# Patient Record
Sex: Male | Born: 1985 | Race: White | Hispanic: No | Marital: Married | Smoking: Current every day smoker
Health system: Southern US, Community
[De-identification: ages and names within clinical notes are randomized; demographics above are authoritative.]

## PROBLEM LIST (undated history)

## (undated) DIAGNOSIS — S0219XA Other fracture of base of skull, initial encounter for closed fracture: Secondary | ICD-10-CM

## (undated) HISTORY — DX: Other fracture of base of skull, initial encounter for closed fracture: S02.19XA

## (undated) HISTORY — PX: WISDOM TOOTH EXTRACTION: SHX21

---
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
## 2008-12-12 ENCOUNTER — Inpatient Hospital Stay (HOSPITAL_COMMUNITY): Admission: EM | Admit: 2008-12-12 | Discharge: 2008-12-13 | Payer: Self-pay | Admitting: Emergency Medicine

## 2011-02-01 LAB — URINALYSIS, ROUTINE W REFLEX MICROSCOPIC
Bilirubin Urine: NEGATIVE
Glucose, UA: NEGATIVE mg/dL
Hgb urine dipstick: NEGATIVE
Ketones, ur: 15 mg/dL — AB
Nitrite: NEGATIVE
Protein, ur: NEGATIVE mg/dL
Specific Gravity, Urine: 1.023 (ref 1.005–1.030)
Urobilinogen, UA: 1 mg/dL (ref 0.0–1.0)
pH: 6 (ref 5.0–8.0)

## 2011-02-01 LAB — CBC
HCT: 38.2 % — ABNORMAL LOW (ref 39.0–52.0)
Hemoglobin: 13.7 g/dL (ref 13.0–17.0)
MCHC: 35.8 g/dL (ref 30.0–36.0)
MCV: 86.9 fL (ref 78.0–100.0)
Platelets: 227 10*3/uL (ref 150–400)
RBC: 4.4 MIL/uL (ref 4.22–5.81)
RDW: 12.7 % (ref 11.5–15.5)
WBC: 11.1 10*3/uL — ABNORMAL HIGH (ref 4.0–10.5)

## 2011-02-01 LAB — CULTURE, ROUTINE-ABSCESS

## 2011-02-01 LAB — ANAEROBIC CULTURE

## 2011-03-01 NOTE — Op Note (Signed)
NAME:  MALAKYE, NOLDEN           ACCOUNT NO.:  [id number]   MEDICAL RECORD NO.:  [id number]          PATIENT TYPE:  INP   LOCATION:  5114                         FACILITY:  [facility]   PHYSICIAN:  Angelia Mould. Derrell Lolling, M.D.DATE OF BIRTH:  [birth year]   DATE OF PROCEDURE:  12/12/2008  DATE OF DISCHARGE:                               OPERATIVE REPORT   PREOPERATIVE DIAGNOSIS:  Perirectal abscess.   POSTOPERATIVE DIAGNOSIS:  Perirectal abscess.   OPERATION PERFORMED:  Anoscopy and incision and drainage of deep  ischiorectal abscess (left posterior position).   SURGEON:  Angelia Mould. Derrell Lolling, MD   OPERATIVE INDICATIONS:  This is a healthy 25 year old Caucasian male who  presents with a 4-day history of progressive pain and swelling in his  left perianal area.  He came to the emergency room.  We were asked to  see him.  Examination reveals a 5-cm fluctuant perirectal abscess in the  left posterior position with some overlying skin blistering.  He is  brought to the operating room urgently.   OPERATIVE TECHNIQUE:  Following the induction of general endotracheal  anesthesia, the patient was identified as correct patient and correct  procedure and correct site.  He was placed in a dorsal lithotomy  position.  Perianal area was prepped and draped in sterile fashion.  He  had received intravenous antibiotics prior to the procedure.  I  performed anoscopy with a large anoscope and looked circumferentially  and the anal canal and distal rectal mucosa all looked normal.  I did  not see any draining sinuses or any evidence of inflammation.  Digital  rectal exam revealed really no bulging into the rectal lumen.   I then performed an incision and drainage of the perirectal abscess by  making a radially oriented elliptical incision taking about a 1-cm  ellipse of skin.  The incision itself was probably 3-4 cm in length.  I  cultured the purulent material which was foul smelling.  I evacuated all  of  this, digitally explored the abscess cavity to make sure that all  loculations were broken up.  This was then irrigated with saline.  The  tissues were inflamed and bled freely but after holding pressure for  about 3 minutes, there was almost no bleeding.  I irrigated out one more  time.  I then packed the wound loosely with 1-inch Iodoform gauze.  Bulky dry absorbent dressing was placed, and the patient was taken to  recovery room in stable condition.  Estimated blood loss was about 25  mL.  Complications none.  Sponge, needle, and instrument counts were  correct.      Angelia Mould. Derrell Lolling, M.D.  Electronically Signed     HMI/MEDQ  D:  12/12/2008  T:  12/12/2008  Job:  [id number]

## 2011-03-01 NOTE — H&P (Signed)
NAME:  William Johnson, William Johnson           ACCOUNT NO.:  [id number]   MEDICAL RECORD NO.:  [id number]          PATIENT TYPE:  INP   LOCATION:  2550                         FACILITY:  [facility]   PHYSICIAN:  Angelia Mould. Derrell Lolling, M.D.DATE OF BIRTH:  11-03-1985   DATE OF ADMISSION:  12/12/2008  DATE OF DISCHARGE:                              HISTORY & PHYSICAL   CHIEF COMPLAINT:  Left perirectal abscess.   HISTORY OF PRESENT ILLNESS:  William Johnson is an otherwise healthy 25-  year-old male patient presented to the ED during the night because of  complaints of dysuria and difficulty voiding as well as severe buttock  pain.  On exam, he was found to have a very large left perirectal  abscess.  CBC is pending at the time of dictation.  Urinalysis is  negative.  The patient was aware he had some sort of small, tender, not  like the area there, but has never had anything like this before.   REVIEW OF SYSTEMS:  As per history of present illness.  No fevers, no  chills, no diarrhea.  He has had increased pain over several days.  He  denies any injury or any prior issues of perirectal abscess.   SOCIAL HISTORY:  Positive for tobacco half a pack per day, socially  alcohol.  Lives with his parents.   FAMILY HISTORY:  Noncontributory.   PAST MEDICAL HISTORY:  The patient denies.   PAST SURGICAL HISTORY:  Wisdom teeth.   DRUG ALLERGIES:  NKDA.   MEDICATIONS:  None.   PHYSICAL EXAMINATION:  GENERAL:  Pleasant male patient complaining of  left perirectal pain.  States he has voided since arrival to the ER.  VITAL SIGNS:  Initial temperature was 100.8, now it is 97.8; BP is  91/44; presenting heart rate was 127, now it is 106; respirations 13,  sat is 95-96% on room air.  PSYCHIATRIC:  The patient is alert and oriented x3.  Affect is  appropriate for the current situation.  NEUROLOGIC:  Cranial nerves II through XII are grossly intact.  He is  moving all extremities x4 without focal deficits.  HEENT:   Eyes, sclerae are slightly injected.  Ear, nose, and throat;  ears are symmetrical.  No otorrhea.  Nose is midline.  No rhinorrhea.  Oral mucous membranes pink and moist.  CHEST:  Bilateral lung sounds are clear to auscultation.  Respiratory  effort is nonlabored.  He is on room air.  No tachypnea.  CARDIOVASCULAR:  Heart sounds S1 and S2.  No rubs, murmurs, or gallops.  No JVD.  Pulse is regular and tachycardic, but heart rate is less than  125.  No peripheral edema.  ABDOMEN:  Soft, nontender, and nondistended without hepatosplenomegaly,  masses, scars, or bruits.  EXTREMITIES:  Symmetrical in appearance without cyanosis or clubbing.  GENITOURINARY:  External genitalia are normal in appearance.  Both  testes are descended.  There is no tracking of the abscess into the  scrotum or penis.  RECTAL:  There is a 5-6 cm. abscess in the left posterior position with  overlying skin bullae. There is no skin necrosis. External  rectal exam  was performed on DRE.  No drainage.  Small external hemorrhoids.  Tone  appears to be normal.   LABORATORY STUDIES:  CBC pending.  Urinalysis is normal.   IMPRESSION:  Left perirectal abscess, 5-6 cm.   PLAN:  Dr. Derrell Lolling has interviewed and examined the patient and discussed  operative intervention need with the patient and his father.  The  patient does have a very large perirectal abscess that would be best  drained in the OR setting.  Both agree that this is appropriate and the  patient is amenable to surgery.   Empiric Zosyn for enteric pathogen coverage.   Treat symptoms, IV fluids for tachycardia and volume depletion.  Morphine for pain and Zofran for nausea.      Allison L. Rennis Harding, N.P.      Angelia Mould. Derrell Lolling, M.D.  Electronically Signed    ALE/MEDQ  D:  12/12/2008  T:  12/12/2008  Job:  [id number]

## 2011-03-04 NOTE — Op Note (Signed)
NAME:  BRAVE, DACK NO.:  [id number]   MEDICAL RECORD NO.:  [id number]                   PATIENT TYPE:  AMB   LOCATION:  DSC                                  FACILITY:  [facility]   PHYSICIAN:  Leonides Grills, M.D.                  DATE OF BIRTH:  30-May-1986   DATE OF PROCEDURE:  DATE OF DISCHARGE:                                 OPERATIVE REPORT   PREOPERATIVE DIAGNOSIS:  Right second metatarsal head Freiberg's infraction.   POSTOPERATIVE DIAGNOSIS:  Right second metatarsal head Freiberg's  infraction.   OPERATION PERFORMED:  1. Right metatarsophalangeal joint local synovectomy.  2. Right partial excision, second metatarsal head.   SURGEON:  Leonides Grills, M.D.   ASSISTANT:  Lianne Cure, P.A.   ANESTHESIA:  General endotracheal.   ESTIMATED BLOOD LOSS:  Minimal.   TOURNIQUET TIME:  Approximately 20 minutes.   COMPLICATIONS:  None.   DISPOSITION:  Stable to the PR.   INDICATIONS FOR PROCEDURE:  The patient is a 25 year old gentleman who has  had pain involving the right second MTP joint to the point where it is  interfering with his life.  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  He was consented for the above  procedure.  All risks which include infection, neurovascular injury,  scarring dorsally with a dorsal contracture of the toe and possible future  dorsiflexion osteotomy for further advancement of the disease were all  explained.  Questions were encouraged and answered.   DESCRIPTION OF PROCEDURE:  The patient was brought to the operating room and  placed in supine position.  After adequate general endotracheal tube  anesthesia was administered as well as Ancef 1 gm IV piggyback.  The right  lower extremity was then prepped and draped in sterile manner over a  proximally placed thigh tourniquet.  The right  lower extremity was then  gravity exsanguinated.  The tourniquet was elevated to 290 mmHg.  Longitudinal incision over the midline aspect of the second MTP joint was  then made.  The extensor tendons were then identified and a capsulotomy was  then made medial to the extensor tendon.  The joint was then entered.  There  was a large amount of synovectomy dorsally and this was debrided with a  rongeur.  There was also a large spur on dorsal aspect of the second  metatarsal head which was removed with a rongeur.  This extended both  medially and laterally as well.  The joint surface was then inspected and  surprisingly it was in pristine condition.  There was no delamination of the  cartilage.  It was well maintained to the head throughout.  There was no  loose bodies as well.  The wound and  joint were copiously irrigated with normal saline.  The  skin was closed with  4-0 nylon.  The toe was ranged and tracked very well and it stayed in  excellent position as well.                                                Leonides Grills, M.D.    PB/MEDQ  D:  10/08/2002  T:  10/08/2002  Job:  [id number]

## 2017-05-15 ENCOUNTER — Emergency Department: Payer: Self-pay

## 2017-05-15 ENCOUNTER — Emergency Department
Admission: EM | Admit: 2017-05-15 | Discharge: 2017-05-15 | Disposition: A | Discharge status: Home / Self Care | Payer: Self-pay | Attending: Emergency Medicine | Admitting: Emergency Medicine

## 2017-05-15 DIAGNOSIS — Y9389 Activity, other specified: Secondary | ICD-10-CM | POA: Insufficient documentation

## 2017-05-15 DIAGNOSIS — W01198A Fall on same level from slipping, tripping and stumbling with subsequent striking against other object, initial encounter: Secondary | ICD-10-CM | POA: Insufficient documentation

## 2017-05-15 DIAGNOSIS — S0990XA Unspecified injury of head, initial encounter: Secondary | ICD-10-CM

## 2017-05-15 DIAGNOSIS — F10920 Alcohol use, unspecified with intoxication, uncomplicated: Secondary | ICD-10-CM

## 2017-05-15 DIAGNOSIS — Y998 Other external cause status: Secondary | ICD-10-CM | POA: Insufficient documentation

## 2017-05-15 DIAGNOSIS — Y9259 Other trade areas as the place of occurrence of the external cause: Secondary | ICD-10-CM | POA: Insufficient documentation

## 2017-05-15 DIAGNOSIS — F10129 Alcohol abuse with intoxication, unspecified: Secondary | ICD-10-CM | POA: Insufficient documentation

## 2017-05-15 DIAGNOSIS — S0101XA Laceration without foreign body of scalp, initial encounter: Secondary | ICD-10-CM | POA: Insufficient documentation

## 2017-05-15 DIAGNOSIS — F17200 Nicotine dependence, unspecified, uncomplicated: Secondary | ICD-10-CM | POA: Insufficient documentation

## 2017-05-15 LAB — CBC
HEMATOCRIT: 42.4 % (ref 40.0–52.0)
Hemoglobin: 14.7 g/dL (ref 13.0–18.0)
MCH: 30 pg (ref 26.0–34.0)
MCHC: 34.5 g/dL (ref 32.0–36.0)
MCV: 87 fL (ref 80.0–100.0)
Platelets: 360 10*3/uL (ref 150–440)
RBC: 4.88 MIL/uL (ref 4.40–5.90)
RDW: 12.8 % (ref 11.5–14.5)
WBC: 10.7 10*3/uL — AB (ref 3.8–10.6)

## 2017-05-15 LAB — BASIC METABOLIC PANEL
ANION GAP: 12 (ref 5–15)
BUN: 9 mg/dL (ref 6–20)
CALCIUM: 9 mg/dL (ref 8.9–10.3)
CO2: 21 mmol/L — AB (ref 22–32)
Chloride: 106 mmol/L (ref 101–111)
Creatinine, Ser: 0.73 mg/dL (ref 0.61–1.24)
Glucose, Bld: 103 mg/dL — ABNORMAL HIGH (ref 65–99)
Potassium: 3.6 mmol/L (ref 3.5–5.1)
Sodium: 139 mmol/L (ref 135–145)

## 2017-05-15 LAB — ETHANOL: Alcohol, Ethyl (B): 382 mg/dL (ref <5)

## 2017-05-15 MED ORDER — LORAZEPAM 2 MG/ML IJ SOLN
2.0000 mg | Freq: Once | INTRAMUSCULAR | Status: AC
  Administered 2017-05-15: 2 mg via INTRAVENOUS

## 2017-05-15 MED ORDER — LIDOCAINE-EPINEPHRINE-TETRACAINE (LET) SOLUTION: 3.0000 mL | Freq: Once | NASAL | Status: DC

## 2017-05-15 MED ORDER — ZIPRASIDONE MESYLATE 20 MG IM SOLR
20.0000 mg | Freq: Once | INTRAMUSCULAR | Status: AC
  Administered 2017-05-15: 20 mg via INTRAMUSCULAR

## 2017-05-15 MED ORDER — ZIPRASIDONE MESYLATE 20 MG IM SOLR
INTRAMUSCULAR | Status: AC
  Filled 2017-05-15: qty 20

## 2017-05-15 MED ORDER — SODIUM CHLORIDE 0.9 % IV BOLUS (SEPSIS)
1000.0000 mL | Freq: Once | INTRAVENOUS | Status: AC
  Administered 2017-05-15: 1000 mL via INTRAVENOUS

## 2017-05-15 MED ORDER — LORAZEPAM 2 MG/ML IJ SOLN
INTRAMUSCULAR | Status: AC
  Administered 2017-05-15: 2 mg via INTRAVENOUS
  Filled 2017-05-15: qty 1

## 2017-05-15 MED ORDER — LORAZEPAM 2 MG/ML IJ SOLN
INTRAMUSCULAR | Status: AC
  Filled 2017-05-15: qty 1

## 2017-05-15 NOTE — ED Triage Notes (Signed)
Patient to ED via EMS for alcohol intoxication and a fall. Presents with laceration to right forehead. He was found on the sidewalk outside of a bar in [place]. [place] [employer] have possession of his wallet and ID. Patient unable to focus visually on what's in front of him and is unable to comprehend what is expected of him. Patient unable to speak coherently.

## 2017-05-15 NOTE — ED Notes (Signed)
Resting quietly with eyes closed. No acute distress noted.

## 2017-05-15 NOTE — ED Notes (Signed)
Spoke [place] [employer]ates regarding [employer]patient's wallet and ID.  Officer Alvina FilbertCoates states that patient's friend has his belongings.

## 2017-05-15 NOTE — ED Notes (Signed)
Patient up with 2 person assist to be cleaned after urinating on self.  Spoke with patient and he gave telephone # [phone] as his father Nelma RothmanDaryl.  When attempted to call went to a voice mail stating he was out until 05/15/2017.

## 2017-05-15 NOTE — ED Notes (Signed)
Resting quietly with eyes closed at this time

## 2017-05-15 NOTE — ED Notes (Signed)
Patient attempting to get out of bed.  Patient oriented to place and time.  Explained need for staying in the bed.  Patient redirected multiple times.

## 2017-05-15 NOTE — ED Provider Notes (Signed)
[facility] Emergency Department Provider Note   ____________________________________________   First MD Initiated Contact with Patient 05/15/17 [phone]     (approximate)  I have reviewed the triage vital signs and the nursing notes.   HISTORY  Chief Complaint Alcohol Intoxication  History is limited by patient's intoxication  HPI William Johnson is a 31 y.o. male who was brought in by ambulance after falling and hitting his head on a concrete floor. The patient was found at a bar in [place] where he had been drinking and had 2 shots of tequila prior to ambulance arrival. EMS states that he has a laceration to the right side of his head. He was initially minimally responsive but became more alert in the ambulance. He has been drinking all day although he will not comment on how much she was drinking. I also asked the patient if he had been doing any drugs and he laughed but did not respond.The patient is very agitated and keeps asking who we are and what are we doing. He is not answering any questions in the emergency department. The patient denies any pain at this time.   History reviewed. No pertinent past medical history.  There are no active problems to display for this patient.   Past Surgical History:  Procedure Laterality Date  . WISDOM TOOTH EXTRACTION      Prior to Admission medications   Not on File    Allergies Patient has no allergy information on record.  No family history on file.  Social History Social History  Substance Use Topics  . Smoking status: Current Every Day Smoker  . Smokeless tobacco: Not on file  . Alcohol use Yes    Review of Systems  Unable to assess due to patient intoxication, agitation and refusal to answer questions ____________________________________________   PHYSICAL EXAM:  VITAL SIGNS: ED Triage Vitals  Enc Vitals Group     BP 05/15/17 0107 133/78     Pulse Rate 05/15/17 0107 (!) 120   Resp --      Temp 05/15/17 0107 98.6 F (37 C)     Temp Source 05/15/17 0107 Axillary     SpO2 05/15/17 0107 97 %     Weight 05/15/17 0108 230 lb (104.3 kg)     Height 05/15/17 0108 6\' 1"  (1.854 m)     Head Circumference --      Peak Flow --      Pain Score 05/15/17 0107 0     Pain Loc --      Pain Edu? --      Excl. in GC? --     Constitutional: Alert and confused. Intoxicated appearing and in no acute distress. Eyes: Conjunctivae are normal. PERRL. EOMI. Head: Laceration to the right temporal scalp Nose: No congestion/rhinnorhea. Mouth/Throat: Mucous membranes are moist.  Oropharynx non-erythematous. Neck: No cervical spine tenderness to palpation Cardiovascular: Tachycardia, regular rhythm. Grossly normal heart sounds.  Good peripheral circulation. Respiratory: Normal respiratory effort.  No retractions. Lungs CTAB. Gastrointestinal: Soft and nontender. No distention. Positive bowel sounds Musculoskeletal: No lower extremity tenderness nor edema.   Neurologic:  Normal speech and language. Skin:  Abrasions to bilateral knees left greater than right Psychiatric: Patient agitated, intoxicated and aggressive   ____________________________________________   LABS (all labs ordered are listed, but only abnormal results are displayed)  Labs Reviewed  CBC - Abnormal; Notable for the following:       Result Value   WBC 10.7 (*)  All other components within normal limits  BASIC METABOLIC PANEL - Abnormal; Notable for the following:    CO2 21 (*)    Glucose, Bld 103 (*)    All other components within normal limits  ETHANOL - Abnormal; Notable for the following:    Alcohol, Ethyl (B) 382 (*)    All other components within normal limits  URINE DRUG SCREEN, QUALITATIVE ([facility] ONLY)   ____________________________________________  EKG  none ____________________________________________  RADIOLOGY  Ct Head Wo Contrast  Result Date: 05/15/2017 CLINICAL DATA:  31 y/o M; Fall  with head injury and laceration to the right forehead. EXAM: CT HEAD WITHOUT CONTRAST CT CERVICAL SPINE WITHOUT CONTRAST TECHNIQUE: Multidetector CT imaging of the head and cervical spine was performed following the standard protocol without intravenous contrast. Multiplanar CT image reconstructions of the cervical spine were also generated. COMPARISON:  None. FINDINGS: CT HEAD FINDINGS Brain: No evidence of acute infarction, hemorrhage, hydrocephalus, extra-axial collection or mass lesion/mass effect. Vascular: No hyperdense vessel or unexpected calcification. Skull: Right forehead small scalp contusion and laceration. No displaced calvarial fracture. Sinuses/Orbits: Patchy opacification of ethmoid air cells, near complete opacification of right sphenoid sinus, and aerosolized secretions in the left sphenoid sinus. Normal aeration of mastoid air cells. Visualized orbits are normal. Other: None. CT CERVICAL SPINE FINDINGS Alignment: Mild reversal of cervical curvature, likely positional. No listhesis. Skull base and vertebrae: No acute fracture. No primary bone lesion or focal pathologic process. Soft tissues and spinal canal: No prevertebral fluid or swelling. No visible canal hematoma. Disc levels: No significant degenerative changes of the cervical spine. Upper chest: Negative. Other: Negative. IMPRESSION: 1. Right forehead small scalp contusion and laceration. No displaced calvarial fracture. 2. No acute intracranial abnormality. 3. Extensive paranasal sinus disease greatest in sphenoid sinuses where there are aerosolized secretions which may represent acute sinusitis. 4. No acute fracture or dislocation of cervical spine. Electronically Signed   By: Mitzi HansenLance  Furusawa-Stratton M.D.   On: 05/15/2017 02:34   Ct Cervical Spine Wo Contrast  Result Date: 05/15/2017 CLINICAL DATA:  31 y/o M; Fall with head injury and laceration to the right forehead. EXAM: CT HEAD WITHOUT CONTRAST CT CERVICAL SPINE WITHOUT CONTRAST  TECHNIQUE: Multidetector CT imaging of the head and cervical spine was performed following the standard protocol without intravenous contrast. Multiplanar CT image reconstructions of the cervical spine were also generated. COMPARISON:  None. FINDINGS: CT HEAD FINDINGS Brain: No evidence of acute infarction, hemorrhage, hydrocephalus, extra-axial collection or mass lesion/mass effect. Vascular: No hyperdense vessel or unexpected calcification. Skull: Right forehead small scalp contusion and laceration. No displaced calvarial fracture. Sinuses/Orbits: Patchy opacification of ethmoid air cells, near complete opacification of right sphenoid sinus, and aerosolized secretions in the left sphenoid sinus. Normal aeration of mastoid air cells. Visualized orbits are normal. Other: None. CT CERVICAL SPINE FINDINGS Alignment: Mild reversal of cervical curvature, likely positional. No listhesis. Skull base and vertebrae: No acute fracture. No primary bone lesion or focal pathologic process. Soft tissues and spinal canal: No prevertebral fluid or swelling. No visible canal hematoma. Disc levels: No significant degenerative changes of the cervical spine. Upper chest: Negative. Other: Negative. IMPRESSION: 1. Right forehead small scalp contusion and laceration. No displaced calvarial fracture. 2. No acute intracranial abnormality. 3. Extensive paranasal sinus disease greatest in sphenoid sinuses where there are aerosolized secretions which may represent acute sinusitis. 4. No acute fracture or dislocation of cervical spine. Electronically Signed   By: Mitzi HansenLance  Furusawa-Stratton M.D.   On: 05/15/2017 02:34  ____________________________________________   PROCEDURES  Procedure(s) performed: None  Procedures  Critical Care performed: No  ____________________________________________   INITIAL IMPRESSION / ASSESSMENT AND PLAN / ED COURSE  Pertinent labs & imaging results that were available during my care of the  patient were reviewed by me and considered in my medical decision making (see chart for details).  This is a 31 year old male who comes into the hospital today after injuring himself at a bar. The patient was very agitated and aggressive when he initially arrived to the ED. We changed his close and did look at his injury. He had some moments of calm but became aggressive and tried to punch at the nurses. We ended up giving the patient Ativan 2 mg 2 doses IV and a dose of Geodon 20 mg IM. He fell asleep however able to perform a CT and gave him a liter of normal saline for his tachycardia. We will continue to monitor the patient until he wakes and then he will be reassessed.     Around 5 AM the patient woke up and kept trying to take off his oxygen. We again attempted to keep the patient,. Around 7:30 patient was becoming very upset again and took out all of his IVs. He would not keep his oxygen on. The patient is still agitated and reports he wants to go home. He called his father who came to pick him up and except responsible for the patient. Again the patient was not calm enough for [place] to repair his laceration. He reports that he just wants to go home. The patient's CT scans are unremarkable aside from a scalp contusion and laceration. The patient will be discharged home in the care of his father. He was walking with a steady gait but was still intoxicated. He refused to allow [place] to repair his laceration. ____________________________________________   FINAL CLINICAL IMPRESSION(S) / ED DIAGNOSES  Final diagnoses:  Alcoholic intoxication without complication (HCC)  Injury of head, initial encounter  Laceration of scalp without foreign body, initial encounter      NEW MEDICATIONS STARTED DURING THIS VISIT:  New Prescriptions   No medications on file     Note:  This document was prepared using Dragon voice recognition software and may include unintentional dictation errors.    Rebecka Apley, MD 05/15/17 [phone]

## 2017-05-15 NOTE — ED Notes (Signed)
Patient will not answer questions during the assessment. Asked about illicit drug use and he laughs and doesn't answer.

## 2017-05-15 NOTE — ED Notes (Signed)
Patient uncooperative and unable to follow commands. Patient attempting to get out of bed and pull off medical devices as well as pull out IV. Does not understand us telling him to stop. 4 RN's in room for assistance at this time. Patient finally laid down and allowed us to reattach medical devices.

## 2017-05-15 NOTE — ED Notes (Signed)
Report to Dawn, RN.

## 2017-05-15 NOTE — ED Notes (Signed)
Assumed care of patient.  Patient resting with eyes closed at this time.  No acute distress noted. 

## 2017-05-15 NOTE — ED Notes (Signed)
Continues to rest quietly. 

## 2018-10-14 IMAGING — CT CT HEAD W/O CM
5 of 8 series · 17 of 47 positions shown, 18 images · non-contrast
Comparison: None.

CLINICAL DATA: 31 y/o M; Fall with head injury and laceration to
the right forehead.

EXAM:
CT HEAD WITHOUT CONTRAST
CT CERVICAL SPINE WITHOUT CONTRAST
TECHNIQUE: Multidetector CT imaging of the head and cervical spine was
performed following the standard protocol without intravenous
contrast. Multiplanar CT image reconstructions of the cervical spine
were also generated.

[Series 4: head wo · axial · 0.42mm/px · z∈[-51,-6]mm · 2 of 28 slices shown, 3 images]
[im 10/28  brain]
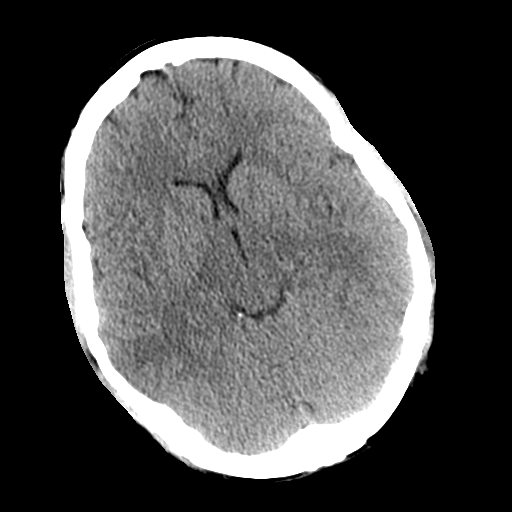
[im 10/28  bone]
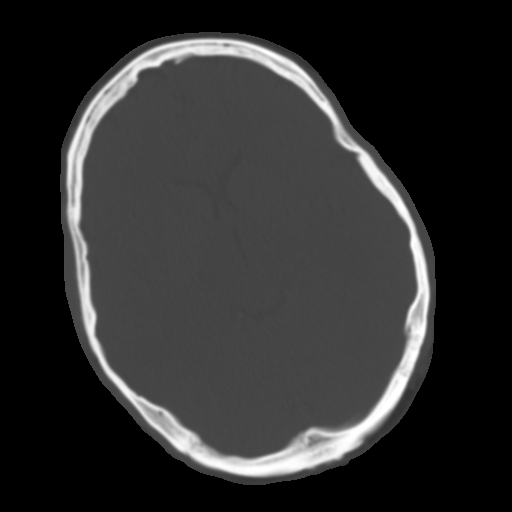
[im 19/28  brain]
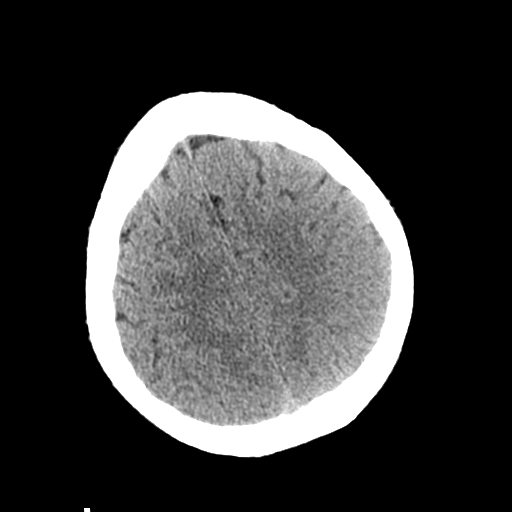

[Series 5: coronal soft tissue · coronal · 0.27mm/px · 3 of 68 slices shown]
[im 23/68  brain]
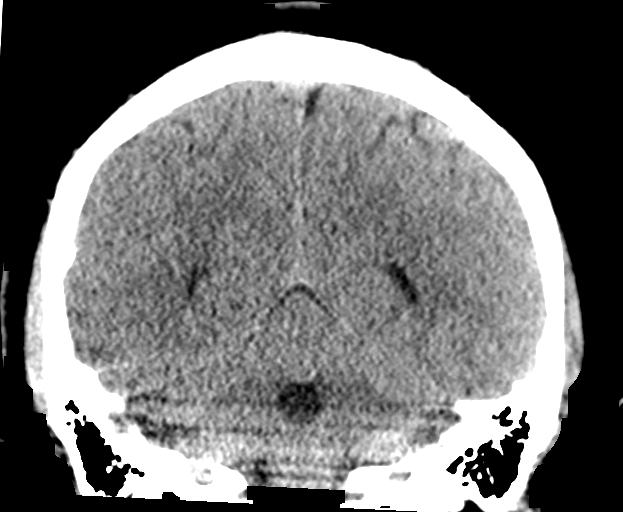
[im 34/68  brain]
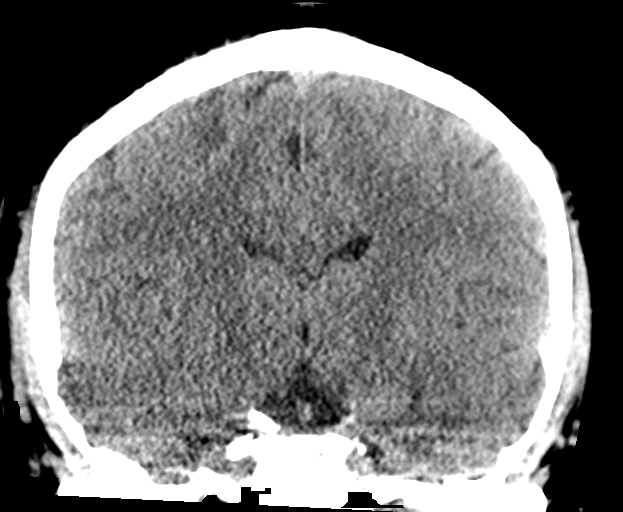
[im 45/68  brain]
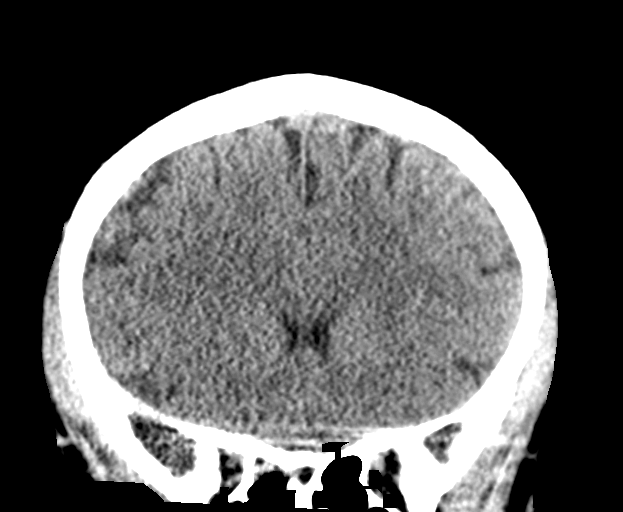

[Series 6: sagittal soft tissue · sagittal · 0.27mm/px · 1 of 57 slices shown]
[im 29/57  brain]
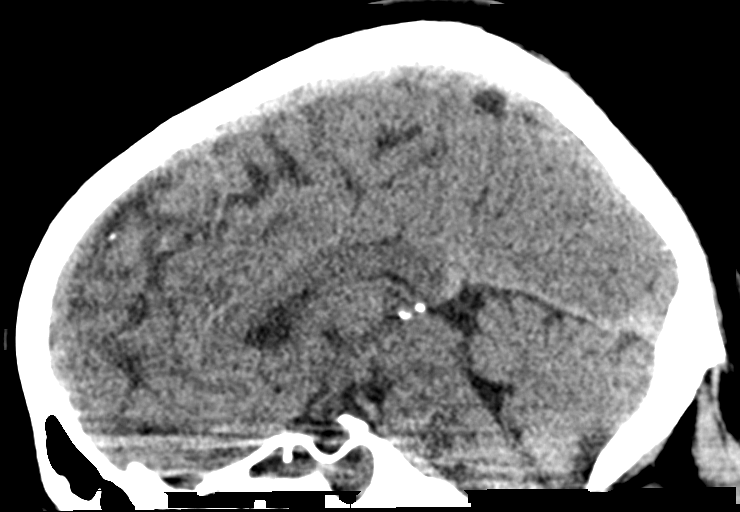

[Series 9: c spine soft · axial · 0.50mm/px · z∈[-231,-191]mm · 3 of 81 slices shown]
[im 11/81  brain]
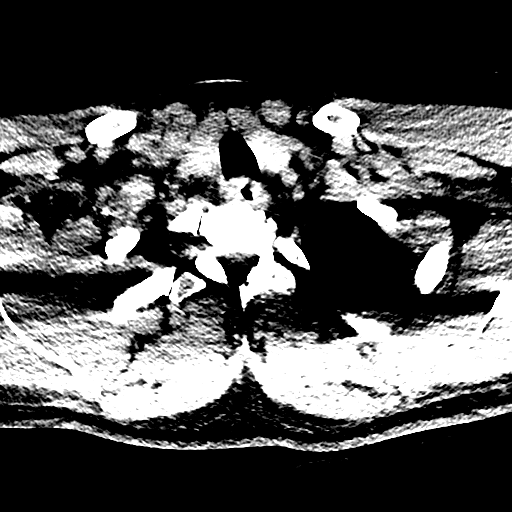
[im 21/81  brain]
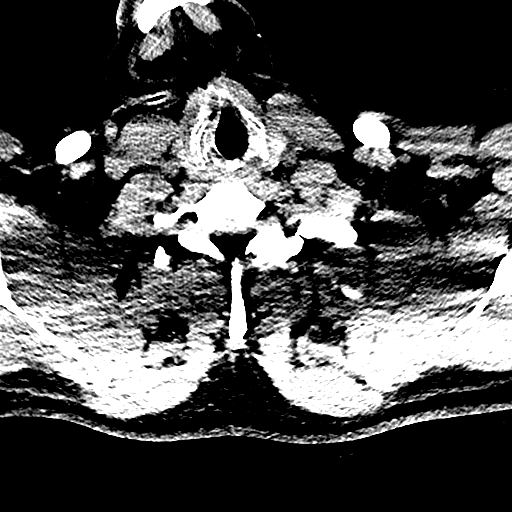
[im 31/81  brain]
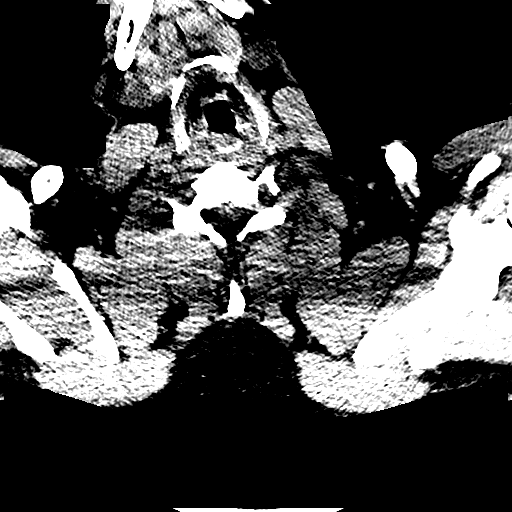

[Series 12: orthogonal bone · axial · 0.27mm/px · z∈[-298,-132]mm · 8 of 119 slices shown]
[im 10/119  bone]
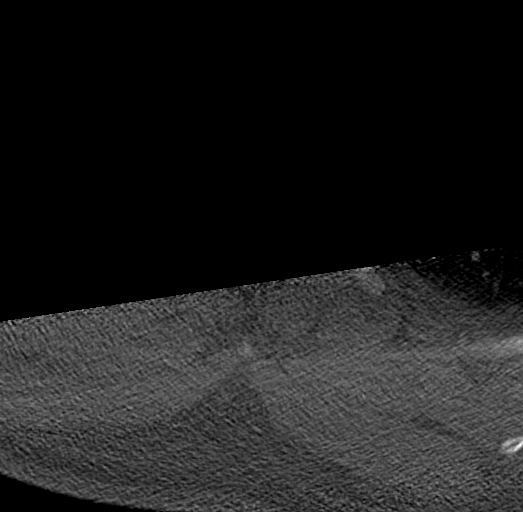
[im 28/119  bone]
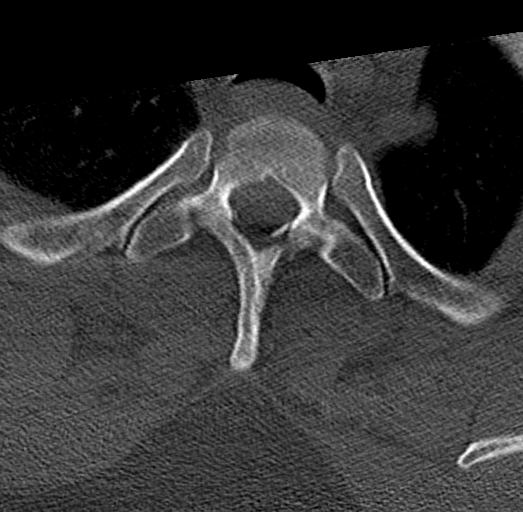
[im 37/119  bone]
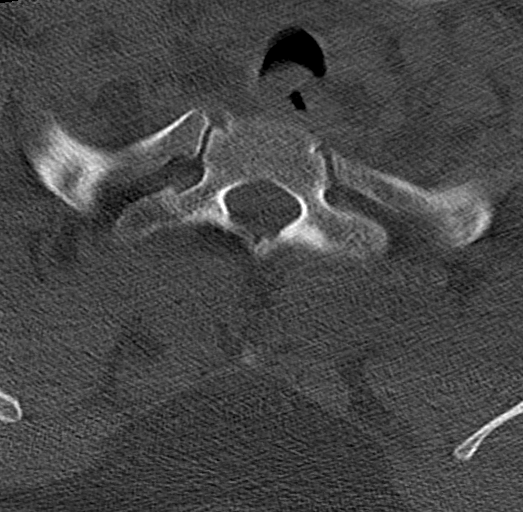
[im 55/119  bone]
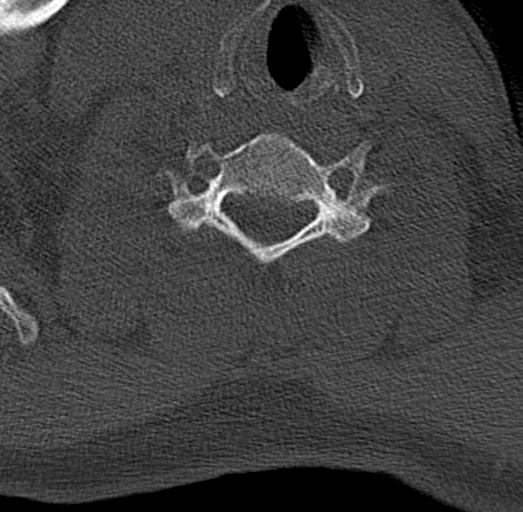
[im 64/119  bone]
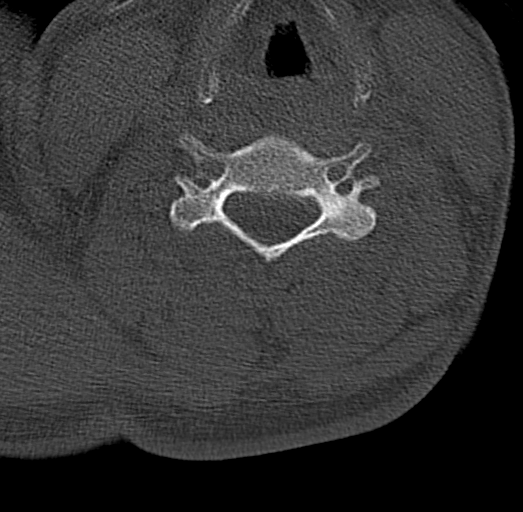
[im 82/119  bone]
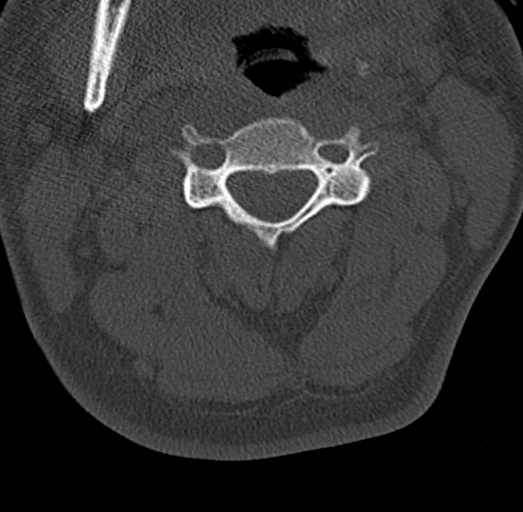
[im 91/119  bone]
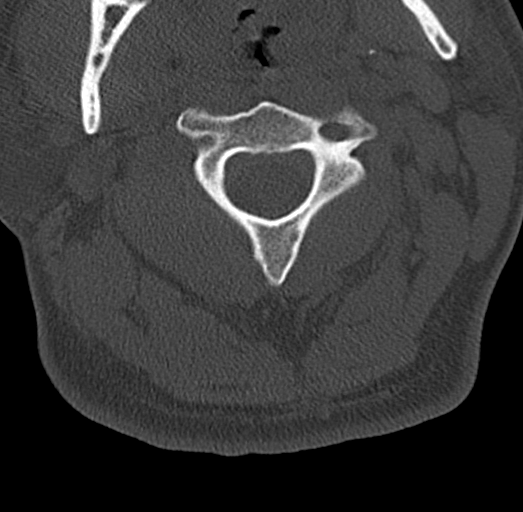
[im 109/119  bone]
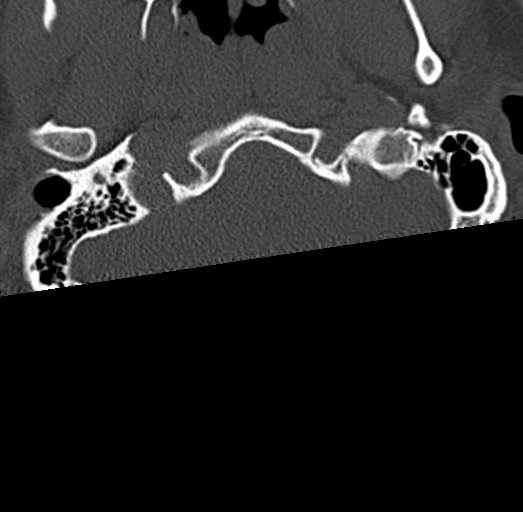

[17 of 47 positions shown; findings below may reference images not displayed]

FINDINGS: CT HEAD FINDINGS

Brain: No evidence of acute infarction, hemorrhage, hydrocephalus,
extra-axial collection or mass lesion/mass effect.

Vascular: No hyperdense vessel or unexpected calcification.

Skull: Right forehead small scalp contusion and laceration. No
displaced calvarial fracture.

Sinuses/Orbits: Patchy opacification of ethmoid air cells, near
complete opacification of right sphenoid sinus, and aerosolized
secretions in the left sphenoid sinus. Normal aeration of mastoid
air cells. Visualized orbits are normal.

Other: None.

CT CERVICAL SPINE FINDINGS

Alignment: Mild reversal of cervical curvature, likely positional.
No listhesis.

Skull base and vertebrae: No acute fracture. No primary bone lesion
or focal pathologic process.

Soft tissues and spinal canal: No prevertebral fluid or swelling. No
visible canal hematoma.

Disc levels: No significant degenerative changes of the cervical
spine.

Upper chest: Negative.

Other: Negative.
IMPRESSION: 1. Right forehead small scalp contusion and laceration. No displaced
calvarial fracture.
2. No acute intracranial abnormality.
3. Extensive paranasal sinus disease greatest in sphenoid sinuses
where there are aerosolized secretions which may represent acute
sinusitis.
4. No acute fracture or dislocation of cervical spine.

By: Francesco Andrea Palozzi M.D.

## 2020-02-02 ENCOUNTER — Other Ambulatory Visit: Payer: Self-pay

## 2020-02-02 ENCOUNTER — Encounter: Payer: Self-pay | Admitting: Emergency Medicine

## 2020-02-02 ENCOUNTER — Ambulatory Visit
Admission: EM | Admit: 2020-02-02 | Discharge: 2020-02-02 | Disposition: A | Discharge status: Home / Self Care | Payer: Medicaid Other | Attending: Family Medicine | Admitting: Family Medicine

## 2020-02-02 DIAGNOSIS — Z4802 Encounter for removal of sutures: Secondary | ICD-10-CM | POA: Diagnosis not present
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
## 2021-05-10 ENCOUNTER — Encounter: Payer: Self-pay | Admitting: Emergency Medicine

## 2021-05-10 ENCOUNTER — Other Ambulatory Visit: Payer: Self-pay

## 2021-05-10 ENCOUNTER — Ambulatory Visit
Admission: EM | Admit: 2021-05-10 | Discharge: 2021-05-10 | Disposition: A | Discharge status: Home / Self Care | Payer: Medicaid Other | Attending: Family Medicine | Admitting: Family Medicine

## 2021-05-10 DIAGNOSIS — M948X9 Other specified disorders of cartilage, unspecified sites: Secondary | ICD-10-CM

## 2021-05-10 MED ORDER — CIPROFLOXACIN HCL 500 MG PO TABS: 500.0000 mg | ORAL_TABLET | Freq: Two times a day (BID) | ORAL | 0 refills | Status: DC

## 2021-05-10 MED ORDER — MUPIROCIN 2 % EX OINT: 1.0000 "application " | TOPICAL_OINTMENT | Freq: Three times a day (TID) | CUTANEOUS | 0 refills | Status: AC

## 2021-05-10 NOTE — Discharge Instructions (Addendum)
Keep the area clean.  Antibiotics as prescribed.  Take care  Dr. Adriana Simas

## 2021-05-10 NOTE — ED Triage Notes (Signed)
Pt presents today with outer right ear rash/redness x 1 week. Denies pain or itching.

## 2021-05-10 NOTE — ED Provider Notes (Signed)
[facility]    CSN: [id number] Arrival date & time: 05/10/21  1356      History   Chief Complaint Chief Complaint  Patient presents with   Rash    HPI  35 year old male presents with the above complaint.  Patient has an area of concern of his right external ear.  Started on Tuesday.  Patient states that he has an area that has been draining.  Location: Helix of right ear.  No trauma or injury.  No relieving factors.  No pain.  No itching.  He has been keeping the area clean.  No other complaints or concerns at this time.  Home Medications    Prior to Admission medications   Medication Sig Start Date End Date Taking? Authorizing Provider  ciprofloxacin (CIPRO) 500 MG tablet Take 1 tablet (500 mg total) by mouth 2 (two) times daily. 05/10/21  Yes Keely Drennan G, DO  mupirocin ointment (BACTROBAN) 2 % Apply 1 application topically 3 (three) times daily for 7 days. 05/10/21 05/17/21 Yes Tommie Sams, DO    Family History Family History  Problem Relation Age of Onset   COPD Mother    Other Father        unknown medical history    Social History Social History   Tobacco Use   Smoking status: Every Day    Packs/day: 0.50    Years: 15.00    Pack years: 7.50    Types: Cigarettes   Smokeless tobacco: Never  Vaping Use   Vaping Use: Never used  Substance Use Topics   Alcohol use: Yes    Alcohol/week: 14.0 standard drinks    Types: 14 Cans of beer per week    Comment: weekends   Drug use: Not Currently    Types: Cocaine    Comment: last use 2006     Allergies   Patient has no known allergies.   Review of Systems Review of Systems Per HPI  Physical Exam Triage Vital Signs ED Triage Vitals  Enc Vitals Group     BP 05/10/21 1422 (!) 136/94     Pulse Rate 05/10/21 1422 (!) 106     Resp 05/10/21 1422 20     Temp 05/10/21 1422 98.5 F (36.9 C)     Temp Source 05/10/21 1422 Oral     SpO2 05/10/21 1422 98 %     Weight --      Height --      Head  Circumference --      Peak Flow --      Pain Score 05/10/21 1419 0     Pain Loc --      Pain Edu? --      Excl. in GC? --    Updated Vital Signs BP (!) 136/94 (BP Location: Left Arm)   Pulse (!) 106   Temp 98.5 F (36.9 C) (Oral)   Resp 20   SpO2 98%   Visual Acuity Right Eye Distance:   Left Eye Distance:   Bilateral Distance:    Right Eye Near:   Left Eye Near:    Bilateral Near:     Physical Exam Vitals and nursing note reviewed.  Constitutional:      General: He is not in acute distress.    Appearance: Normal appearance. He is not ill-appearing.  HENT:     Head: Normocephalic and atraumatic.     Ears:      Comments: Patient with an area of crusting and drainage  at the labeled location. Eyes:     General:        Right eye: No discharge.        Left eye: No discharge.     Conjunctiva/sclera: Conjunctivae normal.  Pulmonary:     Effort: Pulmonary effort is normal. No respiratory distress.  Neurological:     Mental Status: He is alert.  Psychiatric:        Mood and Affect: Mood normal.        Behavior: Behavior normal.     UC Treatments / Results  Labs (all labs ordered are listed, but only abnormal results are displayed) Labs Reviewed - No data to display  EKG   Radiology No results found.  Procedures Procedures (including critical care time)  Medications Ordered in UC Medications - No data to display  Initial Impression / Assessment and Plan / UC Course  I have reviewed the triage vital signs and the nursing notes.  Pertinent labs & imaging results that were available during my care of the patient were reviewed by me and considered in my medical decision making (see chart for details).    35 year old male presents for evaluation the above.  Patient has a localized area of eschar with some drainage.  Slight erythema.  Concern for developing perichondritis.  Placing on Cipro.  Also placing on Bactroban ointment.  Supportive care.  Final  Clinical Impressions(s) / UC Diagnoses   Final diagnoses:  Perichondritis     Discharge Instructions      Keep the area clean.  Antibiotics as prescribed.  Take care  Dr. Adriana Simas    ED Prescriptions     Medication Sig Dispense Auth. Provider   ciprofloxacin (CIPRO) 500 MG tablet Take 1 tablet (500 mg total) by mouth 2 (two) times daily. 14 tablet Rudie Sermons G, DO   mupirocin ointment (BACTROBAN) 2 % Apply 1 application topically 3 (three) times daily for 7 days. 30 g Tommie Sams, DO      PDMP not reviewed this encounter.   Tommie Sams, [place] 05/10/21 1545

## 2022-08-22 ENCOUNTER — Emergency Department (HOSPITAL_BASED_OUTPATIENT_CLINIC_OR_DEPARTMENT_OTHER)
Admission: EM | Admit: 2022-08-22 | Discharge: 2022-08-22 | Disposition: A | Discharge status: Home / Self Care | Payer: Medicaid Other | Attending: Emergency Medicine | Admitting: Emergency Medicine

## 2022-08-22 ENCOUNTER — Emergency Department (HOSPITAL_BASED_OUTPATIENT_CLINIC_OR_DEPARTMENT_OTHER): Payer: Medicaid Other

## 2022-08-22 ENCOUNTER — Encounter (HOSPITAL_BASED_OUTPATIENT_CLINIC_OR_DEPARTMENT_OTHER): Payer: Self-pay | Admitting: Emergency Medicine

## 2022-08-22 ENCOUNTER — Other Ambulatory Visit: Payer: Self-pay

## 2022-08-22 ENCOUNTER — Ambulatory Visit
Admission: EM | Admit: 2022-08-22 | Discharge: 2022-08-22 | Disposition: A | Discharge status: Home / Self Care | Payer: Medicaid Other

## 2022-08-22 DIAGNOSIS — R300 Dysuria: Secondary | ICD-10-CM | POA: Insufficient documentation

## 2022-08-22 DIAGNOSIS — R1032 Left lower quadrant pain: Secondary | ICD-10-CM

## 2022-08-22 DIAGNOSIS — R000 Tachycardia, unspecified: Secondary | ICD-10-CM | POA: Insufficient documentation

## 2022-08-22 DIAGNOSIS — D72829 Elevated white blood cell count, unspecified: Secondary | ICD-10-CM | POA: Insufficient documentation

## 2022-08-22 DIAGNOSIS — R109 Unspecified abdominal pain: Secondary | ICD-10-CM

## 2022-08-22 DIAGNOSIS — R3911 Hesitancy of micturition: Secondary | ICD-10-CM

## 2022-08-22 DIAGNOSIS — R3989 Other symptoms and signs involving the genitourinary system: Secondary | ICD-10-CM | POA: Insufficient documentation

## 2022-08-22 DIAGNOSIS — R1031 Right lower quadrant pain: Secondary | ICD-10-CM | POA: Insufficient documentation

## 2022-08-22 DIAGNOSIS — I10 Essential (primary) hypertension: Secondary | ICD-10-CM | POA: Insufficient documentation

## 2022-08-22 DIAGNOSIS — R3915 Urgency of urination: Secondary | ICD-10-CM | POA: Insufficient documentation

## 2022-08-22 DIAGNOSIS — R339 Retention of urine, unspecified: Secondary | ICD-10-CM | POA: Insufficient documentation

## 2022-08-22 DIAGNOSIS — R1110 Vomiting, unspecified: Secondary | ICD-10-CM | POA: Insufficient documentation

## 2022-08-22 LAB — CBC
HCT: 49.9 % (ref 39.0–52.0)
Hemoglobin: 17.1 g/dL — ABNORMAL HIGH (ref 13.0–17.0)
MCH: 29.9 pg (ref 26.0–34.0)
MCHC: 34.3 g/dL (ref 30.0–36.0)
MCV: 87.4 fL (ref 80.0–100.0)
Platelets: 375 10*3/uL (ref 150–400)
RBC: 5.71 MIL/uL (ref 4.22–5.81)
RDW: 13.2 % (ref 11.5–15.5)
WBC: 12 10*3/uL — ABNORMAL HIGH (ref 4.0–10.5)
nRBC: 0 % (ref 0.0–0.2)

## 2022-08-22 LAB — URINALYSIS, ROUTINE W REFLEX MICROSCOPIC
Bilirubin Urine: NEGATIVE
Glucose, UA: NEGATIVE mg/dL
Leukocytes,Ua: NEGATIVE
Nitrite: NEGATIVE
Protein, ur: 30 mg/dL — AB
Specific Gravity, Urine: 1.036 — ABNORMAL HIGH (ref 1.005–1.030)
pH: 6.5 (ref 5.0–8.0)

## 2022-08-22 LAB — COMPREHENSIVE METABOLIC PANEL
ALT: 52 U/L — ABNORMAL HIGH (ref 0–44)
AST: 33 U/L (ref 15–41)
Albumin: 4.9 g/dL (ref 3.5–5.0)
Alkaline Phosphatase: 82 U/L (ref 38–126)
Anion gap: 14 (ref 5–15)
BUN: 12 mg/dL (ref 6–20)
CO2: 22 mmol/L (ref 22–32)
Calcium: 9.9 mg/dL (ref 8.9–10.3)
Chloride: 103 mmol/L (ref 98–111)
Creatinine, Ser: 0.81 mg/dL (ref 0.61–1.24)
GFR, Estimated: >60 mL/min (ref >60)
Glucose, Bld: 106 mg/dL — ABNORMAL HIGH (ref 70–99)
Potassium: 4 mmol/L (ref 3.5–5.1)
Sodium: 139 mmol/L (ref 135–145)
Total Bilirubin: 0.8 mg/dL (ref 0.3–1.2)
Total Protein: 8.4 g/dL — ABNORMAL HIGH (ref 6.5–8.1)

## 2022-08-22 LAB — LIPASE, BLOOD: Lipase: 11 U/L (ref 11–51)

## 2022-08-22 MED ORDER — IOHEXOL 300 MG/ML  SOLN
100.0000 mL | Freq: Once | INTRAMUSCULAR | Status: AC | PRN
  Administered 2022-08-22: 100 mL via INTRAVENOUS

## 2022-08-22 MED ORDER — KETOROLAC TROMETHAMINE 15 MG/ML IJ SOLN
15.0000 mg | Freq: Once | INTRAMUSCULAR | Status: AC
  Administered 2022-08-22: 15 mg via INTRAVENOUS
  Filled 2022-08-22: qty 1

## 2022-08-22 MED ORDER — LACTATED RINGERS IV BOLUS
1000.0000 mL | Freq: Once | INTRAVENOUS | Status: AC
  Administered 2022-08-22: 1000 mL via INTRAVENOUS

## 2022-08-22 NOTE — ED Triage Notes (Signed)
Pt reports left lower quadrant abdominal pain, pain when urinating, states he needs to forced himself to pee. x 1 day

## 2022-08-22 NOTE — ED Provider Notes (Signed)
[facility] Note   CSN: [id number] Arrival date & time: 08/22/22  1210     History  Chief Complaint  Patient presents with   Abdominal Pain    [facility] is a 36 y.o. male.   Abdominal Pain    36 year old male presenting to the emergency department with painful urinating.  The history provided by the patient.  He states that since 2am Sunday morning, severe abdominal pain while sleeping. Covered in sweat. Has been having painful urination, urinary urgency and hesitancy. Having severe LLQ abdominal pain when urinating. Bladder pain, vomited one time.  He denies any testicular pain or swelling.  He states that he still has had difficulty initiating stream over the last 24 hours.  He finally states that he was able to void somewhat in the emergency department but it was difficult.  He denies a history of urinary retention or BPH.  He denies any fevers or chills.  Home Medications Prior to Admission medications   Medication Sig Start Date End Date Taking? Authorizing Provider  Multiple Vitamin (MULTIVITAMIN) tablet Take 1 tablet by mouth daily.    [provider]      Allergies    Patient has no known allergies.    Review of Systems   Review of Systems  Gastrointestinal:  Positive for abdominal pain.    Physical Exam Updated Vital Signs BP (!) 157/108 (BP Location: Right Arm)   Pulse (!) 109   Temp 98.7 F (37.1 C)   Resp 16   Ht 5\' 10"  (1.778 m)   Wt 104.3 kg   SpO2 98%   BMI 33.00 kg/m  Physical Exam Vitals and nursing note reviewed.  Constitutional:      General: He is not in acute distress.    Appearance: He is well-developed.  HENT:     Head: Normocephalic and atraumatic.  Eyes:     Conjunctiva/sclera: Conjunctivae normal.  Cardiovascular:     Rate and Rhythm: Normal rate and regular rhythm.     Heart sounds: No murmur heard. Pulmonary:     Effort: Pulmonary effort is normal. No respiratory  distress.     Breath sounds: Normal breath sounds.  Abdominal:     Palpations: Abdomen is soft.     Tenderness: There is abdominal tenderness in the right lower quadrant, suprapubic area and left lower quadrant.  Musculoskeletal:        General: No swelling.     Cervical back: Neck supple.  Skin:    General: Skin is warm and dry.     Capillary Refill: Capillary refill takes less than 2 seconds.  Neurological:     Mental Status: He is alert.  Psychiatric:        Mood and Affect: Mood normal.     ED Results / Procedures / Treatments   Labs (all labs ordered are listed, but only abnormal results are displayed) Labs Reviewed  COMPREHENSIVE METABOLIC PANEL - Abnormal; Notable for the following components:      Result Value   Glucose, Bld 106 (*)    Total Protein 8.4 (*)    ALT 52 (*)    All other components within normal limits  CBC - Abnormal; Notable for the following components:   WBC 12.0 (*)    Hemoglobin 17.1 (*)    All other components within normal limits  URINALYSIS, ROUTINE W REFLEX MICROSCOPIC - Abnormal; Notable for the following components:   Specific Gravity, Urine 1.036 (*)  Hgb urine dipstick TRACE (*)    Ketones, ur TRACE (*)    Protein, ur 30 (*)    All other components within normal limits  LIPASE, BLOOD    EKG None  Radiology No results found.  Procedures Procedures    Medications Ordered in ED Medications - No data to display  ED Course/ Medical Decision Making/ A&P Clinical Course as of 08/22/22 1901  Mon Aug 22, 2022  1615 Stable 36 YOM with difficulty voiding.  [CC]  1615 CT results, foley and urology ref. [CC]    Clinical Course User Index [CC] Glyn Ade, MD                           Medical Decision Making Amount and/or Complexity of Data Reviewed Labs: ordered. Radiology: ordered.  Risk Prescription drug management.    36 year old male presenting to the emergency department with painful urinating.  The history  provided by the patient.  He states that since 2am Sunday morning, severe abdominal pain while sleeping. Covered in sweat. Has been having painful urination, urinary urgency and hesitancy. Having severe LLQ abdominal pain when urinating. Bladder pain, vomited one time.  He denies any testicular pain or swelling.  He states that he still has had difficulty initiating stream over the last 24 hours.  He finally states that he was able to void somewhat in the emergency department but it was difficult.  He denies a history of urinary retention or BPH.  He denies any fevers or chills.  On arrival, the patient was afebrile, tachycardic P109, hypertensive BP 157/108, otherwise saturating well on room air.  Sinus rhythm noted on cardiac telemetry on my evaluation.  Physical exam concerning for bilateral and suprapubic tenderness to palpation of the abdomen.  Concern for potential urinary retention.  Additional differential includes SBO, urinary tract infection, nephrolithiasis, prostatitis.  Urinalysis performed and revealed trace hemoglobin, trace ketones, negative for UTI, lipase was normal, CBC with a leukocytosis to 12.0, evidence of hemoconcentration with a hemoglobin of 17, CMP generally unremarkable without evidence of an AKI.  The patient was administered an LR bolus with subsequent improvement.  A CT abdomen pelvis was performed and was pending.  Pending CT abdomen results, plan for potential Foley catheter insertion and urology follow-up.  Signout given to Dr. Doran Durand at [phone].    Final Clinical Impression(s) / ED Diagnoses Final diagnoses:  None    Rx / DC Orders ED Discharge Orders     None         Ernie Avena, MD 08/22/22 1904

## 2022-08-22 NOTE — Discharge Instructions (Signed)
Please go to the Emergency Room for further evaluation of your symptoms 

## 2022-08-22 NOTE — ED Provider Notes (Signed)
[facility]    CSN: [id number] Arrival date & time: 08/22/22  1026      History   Chief Complaint Chief Complaint  Patient presents with   Abdominal Pain    HPI [facility] is a 36 y.o. male.   Patient presents with 1 days of severe abdominal pain, painful urination, urinary urgency and hesitancy.  Reports each time he uses the bathroom "it is a whole ordeal" and he shakes/sweats, needs a cane to crawl out of the bathroom.  He denies urinary frequency, burning with urination, change in urinary odor, hematuria, new back pain, fever, nausea/vomiting, or penile discharge.  Reports when he urinates, he has severe left lower abdominal pain, intense bladder pain, diaphoresis from pain, and has vomited 1 time from the pain.  Reports he is currently sexually active.      History reviewed. No pertinent past medical history.  There are no problems to display for this patient.   Past Surgical History:  Procedure Laterality Date   WISDOM TOOTH EXTRACTION         Home Medications    Prior to Admission medications   Medication Sig Start Date End Date Taking? Authorizing Provider  Multiple Vitamin (MULTIVITAMIN) tablet Take 1 tablet by mouth daily.   Yes [provider]  ciprofloxacin (CIPRO) 500 MG tablet Take 1 tablet (500 mg total) by mouth 2 (two) times daily. 05/10/21   Coral Spikes, DO    Family History Family History  Problem Relation Age of Onset   COPD Mother    Other Father        unknown medical history    Social History Social History   Tobacco Use   Smoking status: Every Day    Packs/day: 0.50    Years: 15.00    Total pack years: 7.50    Types: Cigarettes   Smokeless tobacco: Never  Vaping Use   Vaping Use: Never used  Substance Use Topics   Alcohol use: Yes    Alcohol/week: 14.0 standard drinks of alcohol    Types: 14 Cans of beer per week    Comment: weekends   Drug use: Not Currently    Types: Cocaine    Comment:  last use 2006     Allergies   Patient has no known allergies.   Review of Systems Review of Systems Per HPI  Physical Exam Triage Vital Signs ED Triage Vitals  Enc Vitals Group     BP 08/22/22 1046 (!) 142/94     Pulse Rate 08/22/22 1046 99     Resp 08/22/22 1046 18     Temp 08/22/22 1046 98.5 F (36.9 C)     Temp Source 08/22/22 1046 Oral     SpO2 08/22/22 1046 97 %     Weight --      Height --      Head Circumference --      Peak Flow --      Pain Score 08/22/22 1045 1     Pain Loc --      Pain Edu? --      Excl. in [place]? --    No data found.  Updated Vital Signs BP (!) 142/94 (BP Location: Right Arm)   Pulse 99   Temp 98.5 F (36.9 C) (Oral)   Resp 18   SpO2 97%   Visual Acuity Right Eye Distance:   Left Eye Distance:   Bilateral Distance:    Right Eye Near:  Left Eye Near:    Bilateral Near:     Physical Exam Vitals and nursing note reviewed.  Constitutional:      General: He is not in acute distress.    Appearance: He is well-developed. He is not toxic-appearing.  Cardiovascular:     Rate and Rhythm: Normal rate and regular rhythm.  Pulmonary:     Effort: Pulmonary effort is normal. No respiratory distress.  Abdominal:     General: Abdomen is flat. Bowel sounds are normal.     Palpations: Abdomen is soft. There is no mass.     Tenderness: There is abdominal tenderness in the right lower quadrant. There is no right CVA tenderness, left CVA tenderness, guarding or rebound. Negative signs include Murphy's sign.  Skin:    General: Skin is warm and dry.     Capillary Refill: Capillary refill takes less than 2 seconds.     Coloration: Skin is not cyanotic, jaundiced or pale.  Neurological:     Mental Status: He is alert and oriented to person, place, and time.      UC Treatments / Results  Labs (all labs ordered are listed, but only abnormal results are displayed) Labs Reviewed - No data to display   EKG   Radiology No results  found.  Procedures Procedures (including critical care time)  Medications Ordered in UC Medications - No data to display  Initial Impression / Assessment and Plan / UC Course  I have reviewed the triage vital signs and the nursing notes.  Pertinent labs & imaging results that were available during my care of the patient were reviewed by me and considered in my medical decision making (see chart for details).   Patient is well-appearing, normotensive, afebrile, not tachycardic, not tachypneic, oxygenating well on room air.    Urinary hesitancy LLQ abdominal pain After multiple cups of water, patient is unable to provide urine sample in urgent care, states he will need a cane to get out of the bathroom Recommended further evaluation and management emergency room for symptoms Differential diagnoses include kidney stone, acute abdomen, STI.  Patient declines further work-up in urgent care today.  Patient stable to transport via private vehicle at this time.   The patient was given the opportunity to ask questions.  All questions answered to their satisfaction.  The patient is in agreement to this plan.   Final Clinical Impressions(s) / UC Diagnoses   Final diagnoses:  Urinary hesitancy  LLQ abdominal pain     Discharge Instructions      Please go to the Emergency Room for further evaluation of your symptoms      ED Prescriptions   None    PDMP not reviewed this encounter.   Valentino Nose, NP 08/22/22 1143

## 2022-08-22 NOTE — ED Triage Notes (Signed)
Pt arrives to ED with c/o abdominal pain The pain is generalized within his abdomen. When he urinates he has suprapubic pain. Urinating makes the pain worse. He is sexually active.

## 2022-08-22 NOTE — ED Provider Notes (Signed)
Clinical Course as of 08/22/22 1650  Mon Aug 22, 2022  1615 Stable 36 YOM with difficulty voiding.  [CC]  1615 CT results, foley and urology ref. [CC]    Clinical Course User Index [CC] Tretha Sciara, MD   Care of patient received from prior provider at 1500, please see their note for initial H&P and plan.  In short, patient with significant urinary discomfort.  He had a large urine movement while in the waiting room and all of his symptoms have grossly improved.  Screening labs notable for slight proteinuria hemoglobinuria but CT scan without residual nephrolithiasis.  I evaluated patient at bedside.  He has had resolution of symptoms.  I performed point-of-care bladder ultrasound that demonstrated appropriate voiding with only 50 mL of postvoid residual.  Given symptomatic improvement, overall well appearance, I believe patient is stable for outpatient care management without indication for acute intervention at this time.  Patient feels comfortable with outpatient plan with supportive care with plan to follow-up and be reassessed by PCP within 48 hours.  EMERGENCY DEPARTMENT ULTRASOUND  Study: Limited Ultrasound of Bladder  INDICATIONS: to assess for urinary retention and/or bladder volume prior to urinary catheter Multiple views of the bladder were obtained in real-time in the transverse and longitudinal planes with a multi-frequency probe.  PERFORMED BY: Myself IMAGES ARCHIVED?: Yes LIMITATIONS:  none INTERPRETATION: Minimal Volume and Volume Measurement 15ml Procedure was performed in the anterior lower pelvis.  Sagittal and coronal views were saved.  Volume measurement was performed.  No foreign bodies or residual nephrolithiases visualized.    Disposition:  I have considered need for hospitalization, however, considering all of the above, I believe this patient is stable for discharge at this time.  Patient/family educated about specific return precautions for given chief  complaint and symptoms.  Patient/family educated about follow-up with PCP.     Patient/family expressed understanding of return precautions and need for follow-up. Patient spoken to regarding all imaging and laboratory results and appropriate follow up for these results. All education provided in verbal form with additional information in written form. Time was allowed for answering of patient questions. Patient discharged.    Emergency Department Medication Summary:   Medications  lactated ringers bolus 1,000 mL (1,000 mLs Intravenous New Bag/Given 08/22/22 1430)  iohexol (OMNIPAQUE) 300 MG/ML solution 100 mL (100 mLs Intravenous Contrast Given 08/22/22 1435)          Tretha Sciara, MD 08/22/22 1652

## 2022-08-23 LAB — URINE CYTOLOGY ANCILLARY ONLY
Chlamydia: NEGATIVE
Comment: NEGATIVE

## 2024-03-19 ENCOUNTER — Encounter: Payer: Self-pay | Admitting: Gastroenterology

## 2024-03-19 ENCOUNTER — Ambulatory Visit (INDEPENDENT_AMBULATORY_CARE_PROVIDER_SITE_OTHER): Payer: Self-pay | Admitting: Gastroenterology

## 2024-03-19 VITALS — BP 142/94 | HR 113 | Ht 70.0 in | Wt 236.2 lb

## 2024-03-19 DIAGNOSIS — R1030 Lower abdominal pain, unspecified: Secondary | ICD-10-CM

## 2024-03-19 DIAGNOSIS — R300 Dysuria: Secondary | ICD-10-CM | POA: Diagnosis not present
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
## 2024-03-19 NOTE — Patient Instructions (Addendum)
 Recommend high fiber diet Drink plenty of fluids  Exercise as tolerated Maintain healthy weight Recommend making a appointment with a urologist or nephrologist   _______________________________________________________  If your blood pressure at your visit was 140/90 or greater, please contact your primary care physician to follow up on this.  _______________________________________________________  If you are age 37 or older, your body mass index should be between 23-30. Your Body mass index is 33.89 kg/m. If this is out of the aforementioned range listed, please consider follow up with your Primary Care Provider.  If you are age 43 or younger, your body mass index should be between 19-25. Your Body mass index is 33.89 kg/m. If this is out of the aformentioned range listed, please consider follow up with your Primary Care Provider.   ________________________________________________________  The [facility] providers would like to encourage you to use MYCHART to communicate with providers for non-urgent requests or questions.  Due to long hold times on the telephone, sending your provider a message by [facility] may be a faster and more efficient way to get a response.  Please allow 48 business hours for a response.  Please remember that this is for non-urgent requests.  _______________________________________________________   Thank you for trusting me with your gastrointestinal care. Deanna May, RNP
